# Patient Record
Sex: Male | Born: 1965 | Race: Black or African American | Hispanic: No | Marital: Married | Smoking: Current some day smoker
Health system: Southern US, Community
[De-identification: ages and names within clinical notes are randomized; demographics above are authoritative.]

---

## 2000-07-09 ENCOUNTER — Emergency Department (HOSPITAL_COMMUNITY): Admission: EM | Admit: 2000-07-09 | Discharge: 2000-07-09 | Payer: Self-pay | Admitting: Emergency Medicine

## 2014-07-23 ENCOUNTER — Encounter (HOSPITAL_COMMUNITY): Payer: Self-pay | Admitting: Emergency Medicine

## 2014-07-23 ENCOUNTER — Emergency Department (HOSPITAL_COMMUNITY)
Admission: EM | Admit: 2014-07-23 | Discharge: 2014-07-24 | Discharge status: Against Medical Advice | Payer: Self-pay | Attending: Emergency Medicine | Admitting: Emergency Medicine

## 2014-07-23 DIAGNOSIS — R0789 Other chest pain: Secondary | ICD-10-CM | POA: Insufficient documentation

## 2014-07-23 DIAGNOSIS — Z880 Allergy status to penicillin: Secondary | ICD-10-CM | POA: Insufficient documentation

## 2014-07-23 DIAGNOSIS — F419 Anxiety disorder, unspecified: Secondary | ICD-10-CM | POA: Insufficient documentation

## 2014-07-23 DIAGNOSIS — R55 Syncope and collapse: Secondary | ICD-10-CM | POA: Insufficient documentation

## 2014-07-23 DIAGNOSIS — R0602 Shortness of breath: Secondary | ICD-10-CM | POA: Insufficient documentation

## 2014-07-23 DIAGNOSIS — Z720 Tobacco use: Secondary | ICD-10-CM | POA: Insufficient documentation

## 2014-07-23 NOTE — ED Provider Notes (Signed)
CSN: [id number]     Arrival date & time 07/23/14  2332 History   First MD Initiated Contact with Patient 07/23/14 2347     This chart was scribed for Loren Racer, MD by Arlan Organ, ED Scribe. This patient was seen in room B15C/B15C and the patient's care was started 12:33 AM.   Chief Complaint  Patient presents with  . Chest Pain  . Loss of Consciousness   The history is provided by the patient. No language interpreter was used.    HPI Comments: Brecken Dufresne brought in by EMS is a 49 y.o. male without any pertinent past medical history who presents to the Emergency Department complaining of intermittent, improved shortness of breath onset just prior to arrival. No associated cough or wheezing. However, he also reports non-radiating central chest pain followed by lowering himself to the ground resulting in a short period of loss of consciousness. No head trauma. Mr. Doren admits he was "worked up" from a phone conversation prior to onset of shortness of breath and chest pain. No recent fever or chills. No leg swelling. He denies any changes or abnormalities with eating habits. Pt with known allergies to Penicillins. Currently symptom-free  History reviewed. No pertinent past medical history. History reviewed. No pertinent past surgical history. History reviewed. No pertinent family history. History  Substance Use Topics  . Smoking status: Current Some Day Smoker  . Smokeless tobacco: Not on file  . Alcohol Use: Yes    Review of Systems  Constitutional: Negative for fever and chills.  Respiratory: Positive for shortness of breath. Negative for cough and wheezing.   Cardiovascular: Positive for chest pain. Negative for leg swelling.  Gastrointestinal: Negative for nausea, vomiting, abdominal pain and diarrhea.  Musculoskeletal: Negative for back pain, neck pain and neck stiffness.  Skin: Negative for rash.  Neurological: Positive for syncope. Negative for dizziness, weakness,  light-headedness, numbness and headaches.  Psychiatric/Behavioral: Negative for confusion. The patient is nervous/anxious.   All other systems reviewed and are negative.     Allergies  Penicillins  Home Medications   Prior to Admission medications   Not on File   Triage Vitals: BP 114/69 mmHg  Pulse 89  Temp(Src) 97.9 F (36.6 C) (Oral)  Resp 23  Ht  (1.88 m)  Wt 170 lb (77.111 kg)  BMI 21.82 kg/m2  SpO2 100%   Physical Exam  Constitutional: He is oriented to person, place, and time. He appears well-developed and well-nourished. No distress.  HENT:  Head: Normocephalic and atraumatic.  Mouth/Throat: Oropharynx is clear and moist. No oropharyngeal exudate.  Eyes: EOM are normal. Pupils are equal, round, and reactive to light.  Neck: Normal range of motion. Neck supple.  No posterior midline cervical tenderness to palpation. No meningismus.  Cardiovascular: Normal rate and regular rhythm.  Exam reveals no gallop and no friction rub.   No murmur heard. Pulmonary/Chest: Effort normal and breath sounds normal. No respiratory distress. He has no wheezes. He has no rales. He exhibits no tenderness.  Abdominal: Soft. Bowel sounds are normal. He exhibits no distension and no mass. There is no tenderness. There is no rebound and no guarding.  Musculoskeletal: Normal range of motion. He exhibits no edema or tenderness.  No calf swelling or tenderness.  Neurological: He is alert and oriented to person, place, and time.  5/5 motor in all extremities. Sensation is fully intact. Speaking with clear voice. Alert and oriented.  Skin: Skin is warm and dry. No rash noted. No  erythema.  Psychiatric: He has a normal mood and affect. His behavior is normal.  Nursing note and vitals reviewed.   ED Course  Procedures (including critical care time)  DIAGNOSTIC STUDIES: Oxygen Saturation is 98% on RA, Normal by my interpretation.    COORDINATION OF CARE: 12:40 AM- Will order CXR, CBC,  EKG, BMP, i-stat troponin. Will give fluids.  Discussed treatment plan with pt at bedside and pt agreed to plan.  a   Labs Review Labs Reviewed  BASIC METABOLIC PANEL - Abnormal; Notable for the following:    Potassium 3.3 (*)    All other components within normal limits  CBC WITH DIFFERENTIAL/PLATELET - Abnormal; Notable for the following:    Hemoglobin 12.7 (*)    HCT 38.6 (*)    Neutrophils Relative % 41 (*)    Lymphocytes Relative 47 (*)    All other components within normal limits  Rosezena SensorI-STAT TROPOININ, ED    Imaging Review Dg Chest Port 1 View  07/24/2014   CLINICAL DATA:  Chest pain, loss of consciousness.  EXAM: PORTABLE CHEST - 1 VIEW  COMPARISON:  None.  FINDINGS: The cardiac silhouette appears upper limits of normal in size, mediastinal silhouette is nonsuspicious. No pleural effusion or focal consolidation. No pneumothorax. Soft tissue planes and included osseous structures are nonsuspicious.  IMPRESSION: Borderline cardiomegaly.  No acute pulmonary process.   Electronically Signed   By: Awilda Metroourtnay  Bloomer M.D.   On: 07/24/2014 00:45     EKG Interpretation   Date/Time:  Saturday Jul 23 2014 23:41:10 EDT Ventricular Rate:  83 PR Interval:  159 QRS Duration: 90 QT Interval:  400 QTC Calculation: 470 R Axis:   81 Text Interpretation:  Sinus rhythm Probable left atrial enlargement No old  tracing to compare Confirmed by Ethelda ChickJACUBOWITZ  MD, SAM [phone]) on 07/23/2014  11:47:38 PM      MDM   Final diagnoses:  Atypical chest pain  Syncope and collapse    I personally performed the services described in this documentation, which was scribed in my presence. The recorded information has been reviewed and is accurate.  Chest pain is very atypical. Likely related to anxiety.  Patient remained symptom-free in the emergency department. Vital signs remained stable. Patient left AMA before results of repeat troponin and discharge instructions.   Loren Raceravid Weltha Cathy, MD 07/24/14 [phone]

## 2014-07-23 NOTE — ED Notes (Signed)
Per EMS, pt was standing at a bus stop with his significant other, when he fell to the ground. The pt's significant other states that the pt lost consciousness. ETOH on board, unsure how much alcohol was consumed. Pt denies drug use. Pt also reporting central, non-radiating chest pain.

## 2014-07-24 ENCOUNTER — Emergency Department (HOSPITAL_COMMUNITY): Payer: Self-pay

## 2014-07-24 LAB — BASIC METABOLIC PANEL
ANION GAP: 9 (ref 5–15)
BUN: 8 mg/dL (ref 6–20)
CO2: 27 mmol/L (ref 22–32)
Calcium: 9 mg/dL (ref 8.9–10.3)
Chloride: 104 mmol/L (ref 101–111)
Creatinine, Ser: 0.87 mg/dL (ref 0.61–1.24)
GFR calc Af Amer: >60 mL/min (ref >60)
GFR calc non Af Amer: >60 mL/min (ref >60)
GLUCOSE: 98 mg/dL (ref 65–99)
Potassium: 3.3 mmol/L — ABNORMAL LOW (ref 3.5–5.1)
Sodium: 140 mmol/L (ref 135–145)

## 2014-07-24 LAB — CBC WITH DIFFERENTIAL/PLATELET
Basophils Absolute: 0 10*3/uL (ref 0.0–0.1)
Basophils Relative: 0 % (ref 0–1)
EOS ABS: 0.1 10*3/uL (ref 0.0–0.7)
Eosinophils Relative: 1 % (ref 0–5)
HCT: 38.6 % — ABNORMAL LOW (ref 39.0–52.0)
Hemoglobin: 12.7 g/dL — ABNORMAL LOW (ref 13.0–17.0)
Lymphocytes Relative: 47 % — ABNORMAL HIGH (ref 12–46)
Lymphs Abs: 2.7 10*3/uL (ref 0.7–4.0)
MCH: 26.6 pg (ref 26.0–34.0)
MCHC: 32.9 g/dL (ref 30.0–36.0)
MCV: 80.9 fL (ref 78.0–100.0)
MONO ABS: 0.7 10*3/uL (ref 0.1–1.0)
Monocytes Relative: 11 % (ref 3–12)
NEUTROS ABS: 2.4 10*3/uL (ref 1.7–7.7)
Neutrophils Relative %: 41 % — ABNORMAL LOW (ref 43–77)
PLATELETS: 258 10*3/uL (ref 150–400)
RBC: 4.77 MIL/uL (ref 4.22–5.81)
RDW: 13.4 % (ref 11.5–15.5)
WBC: 5.8 10*3/uL (ref 4.0–10.5)

## 2014-07-24 LAB — I-STAT TROPONIN, ED
Troponin i, poc: 0 ng/mL (ref 0.00–0.08)
Troponin i, poc: 0 ng/mL (ref 0.00–0.08)

## 2014-07-24 MED ORDER — SODIUM CHLORIDE 0.9 % IV BOLUS (SEPSIS)
1000.0000 mL | Freq: Once | INTRAVENOUS | Status: AC
  Administered 2014-07-24: 1000 mL via INTRAVENOUS

## 2014-07-24 NOTE — ED Notes (Addendum)
Shouting heard from patient's room. This RN went to room to find the pt standing, having taking all of his leads off, shouting to take his IV out and let him go home. This RN explained to the pt that he could experience a worsening of his condition if he left before additional testing was done. Pt acknowledged.

## 2014-07-24 NOTE — ED Notes (Signed)
This RN spoke with [employer] on behalf of the pt, who states that he just needs to bring his discharge papers to be readmitted tonight.

## 2014-07-24 NOTE — ED Notes (Addendum)
This RN went into room when she heard the pt shouting. Pt demanding that this RN take his IV out so that he can leave. Ranae PalmsYelverton, MD called to bedside and pt decided to stay for further testing.

## 2014-08-31 ENCOUNTER — Emergency Department (HOSPITAL_COMMUNITY): Payer: Self-pay

## 2014-08-31 ENCOUNTER — Encounter (HOSPITAL_COMMUNITY): Payer: Self-pay | Admitting: Emergency Medicine

## 2014-08-31 ENCOUNTER — Emergency Department (HOSPITAL_COMMUNITY)
Admission: EM | Admit: 2014-08-31 | Discharge: 2014-08-31 | Disposition: A | Discharge status: Home / Self Care | Payer: Self-pay | Attending: Emergency Medicine | Admitting: Emergency Medicine

## 2014-08-31 DIAGNOSIS — R509 Fever, unspecified: Secondary | ICD-10-CM

## 2014-08-31 DIAGNOSIS — R630 Anorexia: Secondary | ICD-10-CM | POA: Insufficient documentation

## 2014-08-31 DIAGNOSIS — Z880 Allergy status to penicillin: Secondary | ICD-10-CM | POA: Insufficient documentation

## 2014-08-31 DIAGNOSIS — Z720 Tobacco use: Secondary | ICD-10-CM | POA: Insufficient documentation

## 2014-08-31 DIAGNOSIS — J209 Acute bronchitis, unspecified: Secondary | ICD-10-CM | POA: Insufficient documentation

## 2014-08-31 LAB — COMPREHENSIVE METABOLIC PANEL
ALBUMIN: 4.5 g/dL (ref 3.5–5.0)
ALK PHOS: 73 U/L (ref 38–126)
ALT: 12 U/L — ABNORMAL LOW (ref 17–63)
AST: 24 U/L (ref 15–41)
Anion gap: 12 (ref 5–15)
BUN: 12 mg/dL (ref 6–20)
CHLORIDE: 101 mmol/L (ref 101–111)
CO2: 24 mmol/L (ref 22–32)
Calcium: 9.1 mg/dL (ref 8.9–10.3)
Creatinine, Ser: 1.04 mg/dL (ref 0.61–1.24)
GFR calc non Af Amer: >60 mL/min (ref >60)
Glucose, Bld: 81 mg/dL (ref 65–99)
Potassium: 4.3 mmol/L (ref 3.5–5.1)
Sodium: 137 mmol/L (ref 135–145)
Total Bilirubin: 0.6 mg/dL (ref 0.3–1.2)
Total Protein: 9 g/dL — ABNORMAL HIGH (ref 6.5–8.1)

## 2014-08-31 LAB — CBC WITH DIFFERENTIAL/PLATELET
Basophils Absolute: 0 10*3/uL (ref 0.0–0.1)
Basophils Relative: 0 % (ref 0–1)
EOS PCT: 0 % (ref 0–5)
Eosinophils Absolute: 0 10*3/uL (ref 0.0–0.7)
HCT: 41.8 % (ref 39.0–52.0)
Hemoglobin: 13.5 g/dL (ref 13.0–17.0)
LYMPHS ABS: 1.3 10*3/uL (ref 0.7–4.0)
LYMPHS PCT: 9 % — AB (ref 12–46)
MCH: 26.1 pg (ref 26.0–34.0)
MCHC: 32.3 g/dL (ref 30.0–36.0)
MCV: 80.9 fL (ref 78.0–100.0)
MONO ABS: 1.1 10*3/uL — AB (ref 0.1–1.0)
Monocytes Relative: 9 % (ref 3–12)
Neutro Abs: 10.9 10*3/uL — ABNORMAL HIGH (ref 1.7–7.7)
Neutrophils Relative %: 82 % — ABNORMAL HIGH (ref 43–77)
PLATELETS: 240 10*3/uL (ref 150–400)
RBC: 5.17 MIL/uL (ref 4.22–5.81)
RDW: 14.2 % (ref 11.5–15.5)
WBC: 13.3 10*3/uL — AB (ref 4.0–10.5)

## 2014-08-31 LAB — I-STAT CG4 LACTIC ACID, ED: Lactic Acid, Venous: 2.13 mmol/L (ref 0.5–2.0)

## 2014-08-31 LAB — LIPASE, BLOOD: Lipase: 15 U/L — ABNORMAL LOW (ref 22–51)

## 2014-08-31 MED ORDER — ONDANSETRON HCL 4 MG/2ML IJ SOLN
4.0000 mg | Freq: Once | INTRAMUSCULAR | Status: AC
  Administered 2014-08-31: 4 mg via INTRAVENOUS
  Filled 2014-08-31: qty 2

## 2014-08-31 MED ORDER — SODIUM CHLORIDE 0.9 % IV BOLUS (SEPSIS)
1000.0000 mL | Freq: Once | INTRAVENOUS | Status: AC
  Administered 2014-08-31: 1000 mL via INTRAVENOUS

## 2014-08-31 MED ORDER — PREDNISONE 10 MG PO TABS: ORAL_TABLET | ORAL | Status: AC

## 2014-08-31 MED ORDER — LEVOFLOXACIN 750 MG PO TABS: 750.0000 mg | ORAL_TABLET | Freq: Every day | ORAL | Status: AC

## 2014-08-31 MED ORDER — IPRATROPIUM BROMIDE 0.02 % IN SOLN
0.5000 mg | Freq: Once | RESPIRATORY_TRACT | Status: AC
  Administered 2014-08-31: 0.5 mg via RESPIRATORY_TRACT
  Filled 2014-08-31: qty 2.5

## 2014-08-31 MED ORDER — ACETAMINOPHEN 325 MG PO TABS
650.0000 mg | ORAL_TABLET | Freq: Four times a day (QID) | ORAL | Status: DC | PRN
  Administered 2014-08-31: 650 mg via ORAL
  Filled 2014-08-31: qty 2

## 2014-08-31 MED ORDER — AEROCHAMBER Z-STAT PLUS/MEDIUM MISC
1.0000 | Freq: Once | Status: AC
  Administered 2014-08-31: 1

## 2014-08-31 MED ORDER — OXYCODONE-ACETAMINOPHEN 5-325 MG PO TABS
1.0000 | ORAL_TABLET | Freq: Once | ORAL | Status: AC
  Administered 2014-08-31: 1 via ORAL
  Filled 2014-08-31: qty 1

## 2014-08-31 MED ORDER — OXYCODONE-ACETAMINOPHEN 5-325 MG PO TABS: 1.0000 | ORAL_TABLET | ORAL | Status: AC | PRN

## 2014-08-31 MED ORDER — LEVOFLOXACIN IN D5W 750 MG/150ML IV SOLN
750.0000 mg | Freq: Once | INTRAVENOUS | Status: AC
  Administered 2014-08-31: 750 mg via INTRAVENOUS
  Filled 2014-08-31: qty 150

## 2014-08-31 MED ORDER — ALBUTEROL SULFATE HFA 108 (90 BASE) MCG/ACT IN AERS
2.0000 | INHALATION_SPRAY | RESPIRATORY_TRACT | Status: DC | PRN
  Administered 2014-08-31: 2 via RESPIRATORY_TRACT
  Filled 2014-08-31: qty 6.7

## 2014-08-31 MED ORDER — ALBUTEROL SULFATE (2.5 MG/3ML) 0.083% IN NEBU
5.0000 mg | INHALATION_SOLUTION | Freq: Once | RESPIRATORY_TRACT | Status: AC
  Administered 2014-08-31: 5 mg via RESPIRATORY_TRACT
  Filled 2014-08-31: qty 6

## 2014-08-31 NOTE — ED Notes (Signed)
RN Starting IV getting labs currently

## 2014-08-31 NOTE — ED Notes (Signed)
Pt reports generalized body aches x2 days. Has had a productive cough (green/orange/yellow). No other c/c.

## 2014-08-31 NOTE — ED Notes (Signed)
Pt reports still unable to give urine specimen.

## 2014-08-31 NOTE — Discharge Instructions (Signed)
Use the inhaler 2 puffs every 3-4 hours as needed for cough or trouble breathing. Take ibuprofen 600 mg 3 times a day as needed for fever. Avoid tobacco products.   Acute Bronchitis Bronchitis is inflammation of the airways that extend from the windpipe into the lungs (bronchi). The inflammation often causes mucus to develop. This leads to a cough, which is the most common symptom of bronchitis.  In acute bronchitis, the condition usually develops suddenly and goes away over time, usually in a couple weeks. Smoking, allergies, and asthma can make bronchitis worse. Repeated episodes of bronchitis may cause further lung problems.  CAUSES Acute bronchitis is most often caused by the same virus that causes a cold. The virus can spread from person to person (contagious) through coughing, sneezing, and touching contaminated objects. SIGNS AND SYMPTOMS   Cough.   Fever.   Coughing up mucus.   Body aches.   Chest congestion.   Chills.   Shortness of breath.   Sore throat.  DIAGNOSIS  Acute bronchitis is usually diagnosed through a physical exam. Your health care provider will also ask you questions about your medical history. Tests, such as chest X-rays, are sometimes done to rule out other conditions.  TREATMENT  Acute bronchitis usually goes away in a couple weeks. Oftentimes, no medical treatment is necessary. Medicines are sometimes given for relief of fever or cough. Antibiotic medicines are usually not needed but may be prescribed in certain situations. In some cases, an inhaler may be recommended to help reduce shortness of breath and control the cough. A cool mist vaporizer may also be used to help thin bronchial secretions and make it easier to clear the chest.  HOME CARE INSTRUCTIONS  Get plenty of rest.   Drink enough fluids to keep your urine clear or pale yellow (unless you have a medical condition that requires fluid restriction). Increasing fluids may help thin your  respiratory secretions (sputum) and reduce chest congestion, and it will prevent dehydration.   Take medicines only as directed by your health care provider.  If you were prescribed an antibiotic medicine, finish it all even if you start to feel better.  Avoid smoking and secondhand smoke. Exposure to cigarette smoke or irritating chemicals will make bronchitis worse. If you are a smoker, consider using nicotine gum or skin patches to help control withdrawal symptoms. Quitting smoking will help your lungs heal faster.   Reduce the chances of another bout of acute bronchitis by washing your hands frequently, avoiding people with cold symptoms, and trying not to touch your hands to your mouth, nose, or eyes.   Keep all follow-up visits as directed by your health care provider.  SEEK MEDICAL CARE IF: Your symptoms do not improve after 1 week of treatment.  SEEK IMMEDIATE MEDICAL CARE IF:  You develop an increased fever or chills.   You have chest pain.   You have severe shortness of breath.  You have bloody sputum.   You develop dehydration.  You faint or repeatedly feel like you are going to pass out.  You develop repeated vomiting.  You develop a severe headache. MAKE SURE YOU:   Understand these instructions.  Will watch your condition.  Will get help right away if you are not doing well or get worse. Document Released: 03/21/2004 Document Revised: 06/28/2013 Document Reviewed: 08/04/2012 [facility] Patient Information 2015 [employer] [place]. This information is not intended to replace advice given to you by your health care provider. Make sure you discuss any  questions you have with your health care provider. ° °

## 2014-08-31 NOTE — ED Provider Notes (Signed)
CSN: [id number]     Arrival date & time 08/31/14  1810 History   First MD Initiated Contact with Patient 08/31/14 1840     Chief Complaint  Patient presents with  . Generalized Body Aches  . Cough  . Fatigue  . Decreased appetite      (Consider location/radiation/quality/duration/timing/severity/associated sxs/prior Treatment) HPI   Jeremy Beltran is a 49 y.o. male who presents for evaluation of productive cough, achiness and upper abdominal pain. Symptoms onset 2 days ago, and worsened today. He has had fever and chills. He is a cigarette smoker. He denies vomiting, diarrhea, weakness or dizziness. He smokes cigarettes. There are no other known modifying factors.  History reviewed. No pertinent past medical history. History reviewed. No pertinent past surgical history. History reviewed. No pertinent family history. History  Substance Use Topics  . Smoking status: Current Some Day Smoker  . Smokeless tobacco: Not on file  . Alcohol Use: Yes    Review of Systems  All other systems reviewed and are negative.     Allergies  Penicillins  Home Medications   Prior to Admission medications   Not on File   BP 112/58 mmHg  Pulse 97  Temp(Src) 99.7 F (37.6 C) (Oral)  Resp 16  SpO2 97% Physical Exam  Constitutional: He is oriented to person, place, and time. He appears well-developed and well-nourished. He appears distressed (he is uncomfortable).  HENT:  Head: Normocephalic and atraumatic.  Right Ear: External ear normal.  Left Ear: External ear normal.  Eyes: Conjunctivae and EOM are normal. Pupils are equal, round, and reactive to light.  Neck: Normal range of motion and phonation normal. Neck supple.  Cardiovascular: Regular rhythm and normal heart sounds.   Tachycardic  Pulmonary/Chest: Effort normal and breath sounds normal. No respiratory distress. He has no wheezes. He has no rales. He exhibits no bony tenderness.  Few scattered rhonchi. During exam, patient  coughed up bloody mucus.  Abdominal: Soft. He exhibits no mass. There is tenderness (upper, mild). There is no rebound and no guarding.  Musculoskeletal: Normal range of motion. He exhibits no edema or tenderness.  Neurological: He is alert and oriented to person, place, and time. No cranial nerve deficit or sensory deficit. He exhibits normal muscle tone. Coordination normal.  Skin: Skin is warm, dry and intact.  Psychiatric: He has a normal mood and affect. His behavior is normal. Judgment and thought content normal.  Nursing note and vitals reviewed.   ED Course  Procedures (including critical care time)  Medications  acetaminophen (TYLENOL) tablet 650 mg (650 mg Oral Given 08/31/14 1900)  albuterol (PROVENTIL HFA;VENTOLIN HFA) 108 (90 BASE) MCG/ACT inhaler 2 puff (not administered)  aerochamber Z-Stat Plus/medium 1 each (not administered)  oxyCODONE-acetaminophen (PERCOCET/ROXICET) 5-325 MG per tablet 1 tablet (not administered)  ondansetron (ZOFRAN) injection 4 mg (4 mg Intravenous Given 08/31/14 1900)  sodium chloride 0.9 % bolus 1,000 mL (1,000 mLs Intravenous New Bag/Given 08/31/14 1946)  levofloxacin (LEVAQUIN) IVPB 750 mg (750 mg Intravenous New Bag/Given 08/31/14 1946)  albuterol (PROVENTIL) (2.5 MG/3ML) 0.083% nebulizer solution 5 mg (5 mg Nebulization Given 08/31/14 1947)  ipratropium (ATROVENT) nebulizer solution 0.5 mg (0.5 mg Nebulization Given 08/31/14 1947)    Patient Vitals for the past 24 hrs:  BP Temp Temp src Pulse Resp SpO2  08/31/14 2228 - - - - - 97 %  08/31/14 2120 - 99.7 F (37.6 C) Oral - - -  08/31/14 2100 112/58 mmHg - - 97 16 94 %  08/31/14 2000 110/58 mmHg - - 99 16 99 %  08/31/14 1947 - - - - - 94 %  08/31/14 1831 - (!) 103.9 F (39.9 C) Rectal - - -  08/31/14 1825 106/55 mmHg 99.8 F (37.7 C) Oral 112 18 98 %    10:42 PM Reevaluation with update and discussion. After initial assessment and treatment, an updated evaluation reveals he is able ambulate without  shortness of breath. He complains of mild achiness, arms, legs, back and chest. Lungs with improved air movement bilaterally. Ruthene Methvin L   11:00 p.m.- is able to ambulate with normal oxygenation. Vital signs have improved. I discussed with the patient, all questions were answered  Labs Review Labs Reviewed  CBC WITH DIFFERENTIAL/PLATELET - Abnormal; Notable for the following:    WBC 13.3 (*)    Neutrophils Relative % 82 (*)    Neutro Abs 10.9 (*)    Lymphocytes Relative 9 (*)    Monocytes Absolute 1.1 (*)    All other components within normal limits  COMPREHENSIVE METABOLIC PANEL - Abnormal; Notable for the following:    Total Protein 9.0 (*)    ALT 12 (*)    All other components within normal limits  LIPASE, BLOOD - Abnormal; Notable for the following:    Lipase 15 (*)    All other components within normal limits  I-STAT CG4 LACTIC ACID, ED - Abnormal; Notable for the following:    Lactic Acid, Venous 2.13 (*)    All other components within normal limits  CULTURE, BLOOD (ROUTINE X 2)  CULTURE, BLOOD (ROUTINE X 2)  URINE CULTURE  URINALYSIS, ROUTINE W REFLEX MICROSCOPIC (NOT AT [facility])    Imaging Review Dg Chest Port 1 View  08/31/2014   CLINICAL DATA:  Generalized body aches for 2 days. Productive cough. Initial encounter.  EXAM: PORTABLE CHEST - 1 VIEW  COMPARISON:  Single view of the chest 07/23/2013.  FINDINGS: Heart size and mediastinal contours are within normal limits. Both lungs are clear. Visualized skeletal structures are unremarkable.  IMPRESSION: Negative exam.   Electronically Signed   By: Drusilla Kanner M.D.   On: 08/31/2014 18:53     EKG Interpretation None      MDM   Final diagnoses:  Acute bronchitis, unspecified organism    Evaluation consistent with bronchitis without pneumonia. Doubt pneumonia, serious bacterial infection, metabolic instability or impending vascular collapse.  Nursing Notes Reviewed/ Care Coordinated Applicable Imaging  Reviewed Interpretation of Laboratory Data incorporated into ED treatment  The patient appears reasonably screened and/or stabilized for discharge and I doubt any other medical condition or other [facility] requiring further screening, evaluation, or treatment in the ED at this time prior to discharge.  Plan: Home Medications- Zithromax, Albuterol; Home Treatments- stop smokin; return here if the recommended treatment, does not improve the symptoms; Recommended follow up- PCP f/u in 1 week   Mancel Bale, MD 08/31/14 2311

## 2014-09-05 LAB — CULTURE, BLOOD (ROUTINE X 2)
Culture: NO GROWTH
Culture: NO GROWTH

## 2016-10-07 IMAGING — CR DG CHEST 1V PORT
1 series · 2 of 2 positions shown · non-contrast
Comparison: Single view of the chest 07/23/2013.

CLINICAL DATA: Generalized body aches for 2 days. Productive cough.
Initial encounter.

EXAM:
PORTABLE CHEST - 1 VIEW

[Series 1: AP · U · 2 of 2 slices shown]
[im 1/2]
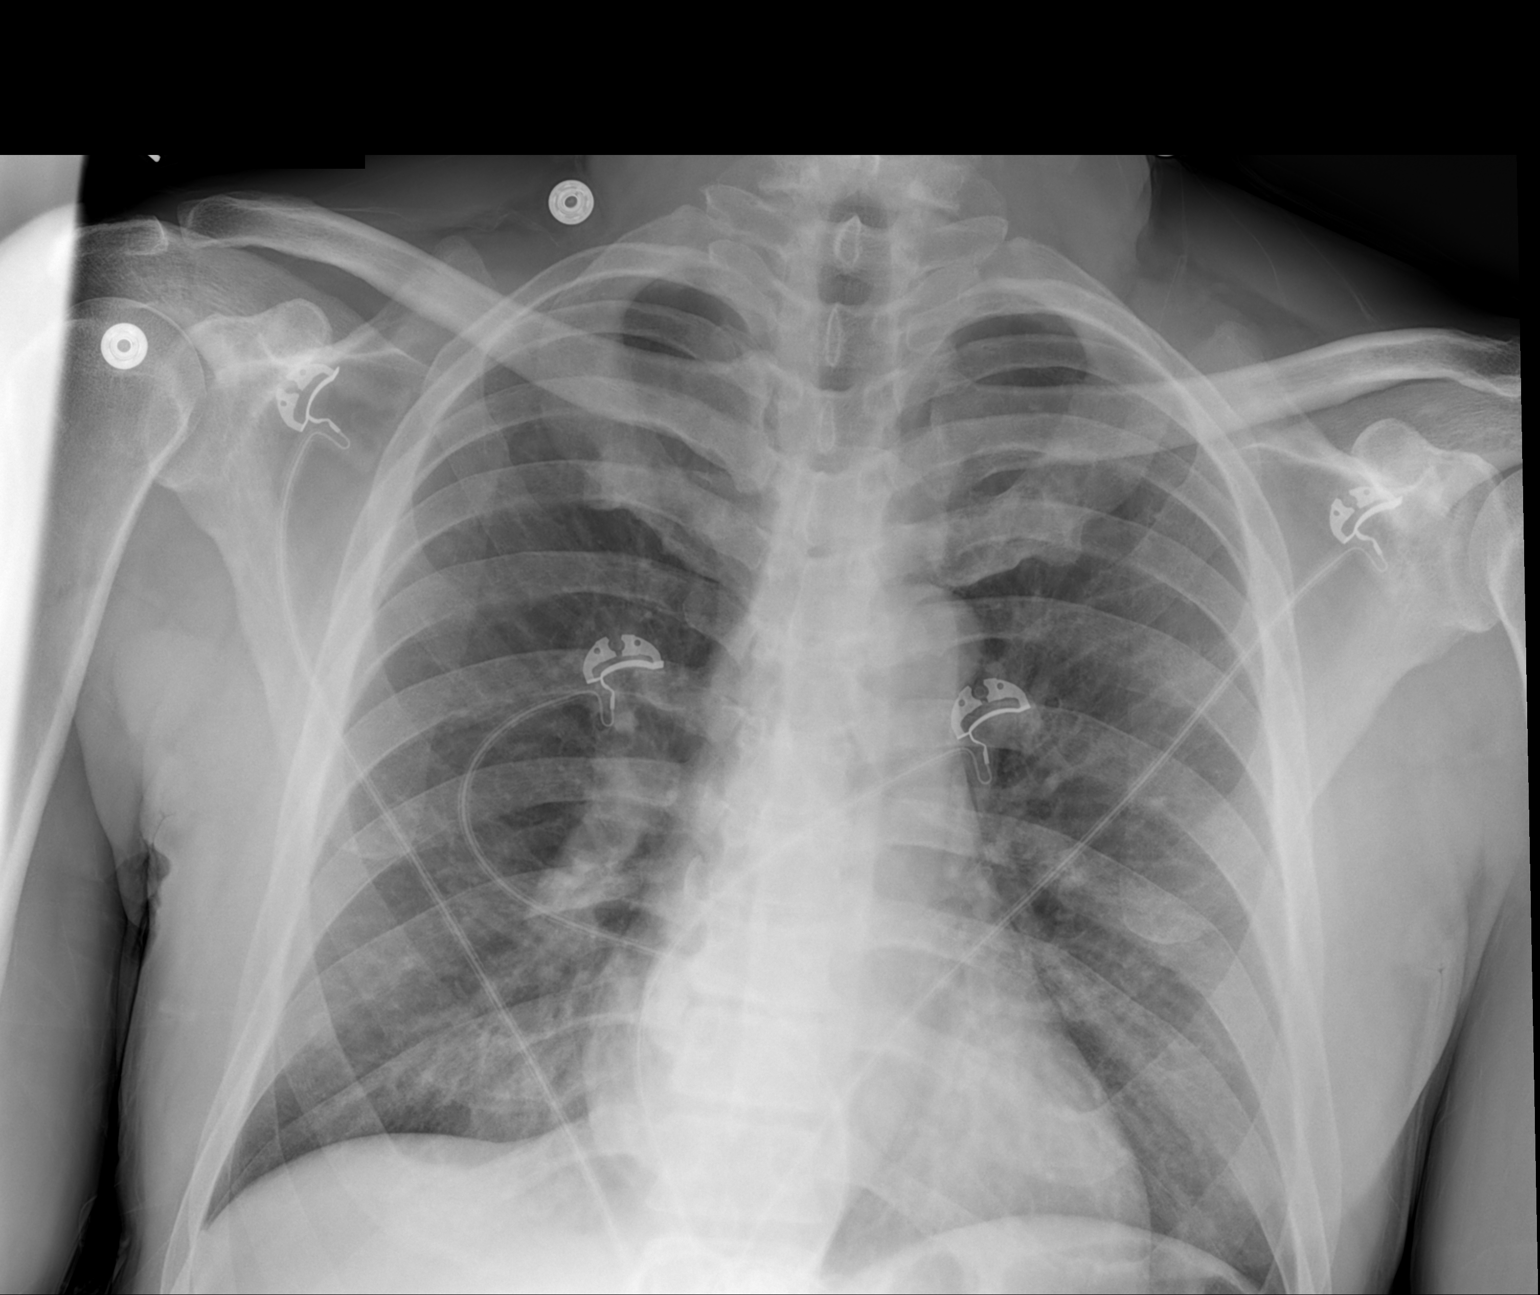
[im 2/2]
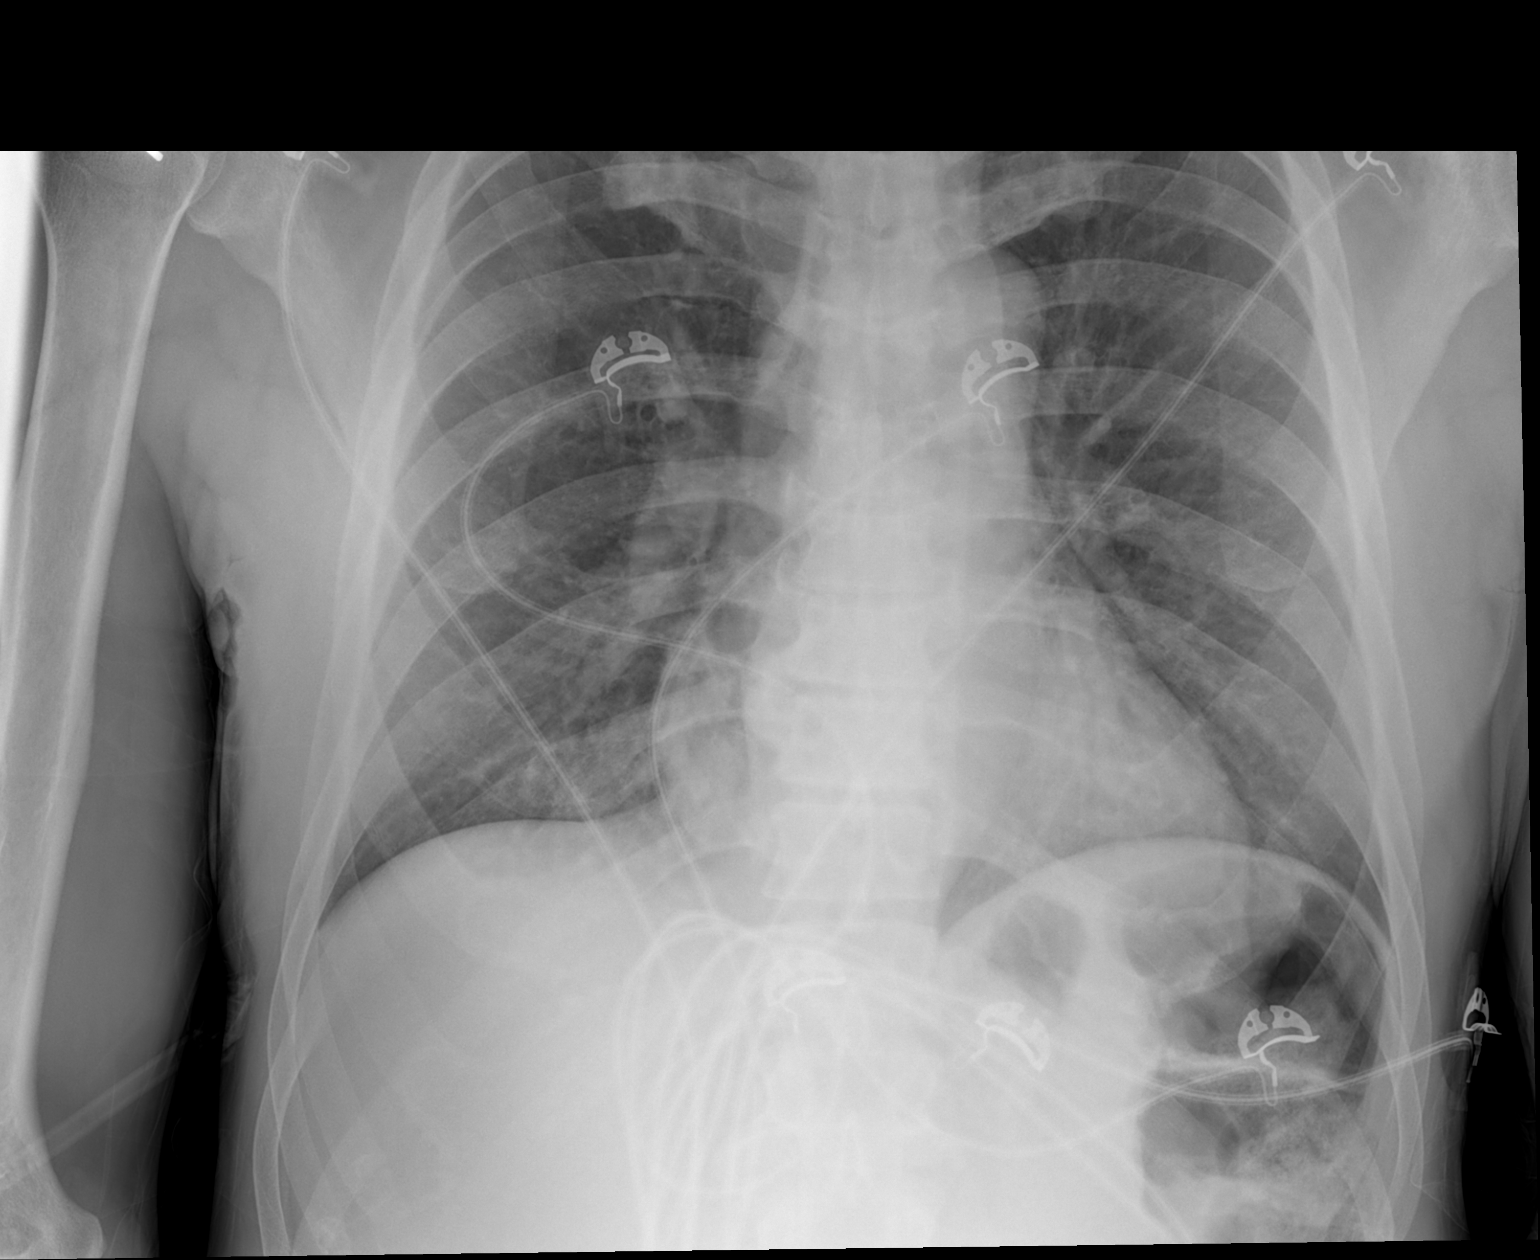

[2 of 2 positions shown; findings below may reference images not displayed]

FINDINGS: Heart size and mediastinal contours are within normal limits. Both
lungs are clear. Visualized skeletal structures are unremarkable.
IMPRESSION: Negative exam.
